# Patient Record
Sex: Male | Born: 1985 | Race: White | Hispanic: No | Marital: Married | Smoking: Never smoker
Health system: Southern US, Community
[De-identification: ages and names within clinical notes are randomized; demographics above are authoritative.]

## PROBLEM LIST (undated history)

## (undated) DIAGNOSIS — T753XXA Motion sickness, initial encounter: Secondary | ICD-10-CM

## (undated) HISTORY — PX: WISDOM TOOTH EXTRACTION: SHX21

## (undated) HISTORY — PX: HERNIA REPAIR: SHX51

---

## 2004-10-03 ENCOUNTER — Emergency Department (HOSPITAL_COMMUNITY): Admission: EM | Admit: 2004-10-03 | Discharge: 2004-10-03 | Payer: Self-pay | Admitting: Family Medicine

## 2004-10-12 ENCOUNTER — Encounter: Admission: RE | Admit: 2004-10-12 | Discharge: 2004-10-12 | Payer: Self-pay | Admitting: Orthopedic Surgery

## 2007-10-15 ENCOUNTER — Emergency Department (HOSPITAL_COMMUNITY): Admission: EM | Admit: 2007-10-15 | Discharge: 2007-10-15 | Payer: Self-pay | Admitting: Family Medicine

## 2011-03-27 LAB — INFLUENZA A AND B ANTIGEN (CONVERTED LAB): Inflenza A Ag: POSITIVE — AB

## 2015-04-28 ENCOUNTER — Encounter: Payer: Self-pay | Admitting: *Deleted

## 2015-05-04 ENCOUNTER — Ambulatory Visit: Payer: BLUE CROSS/BLUE SHIELD | Admitting: Anesthesiology

## 2015-05-04 ENCOUNTER — Ambulatory Visit
Admission: RE | Admit: 2015-05-04 | Discharge: 2015-05-04 | Disposition: A | Discharge status: Home / Self Care | Payer: BLUE CROSS/BLUE SHIELD | Source: Ambulatory Visit | Attending: Surgery | Admitting: Surgery

## 2015-05-04 ENCOUNTER — Encounter
Admission: RE | Disposition: A | Discharge status: Home / Self Care | Payer: Self-pay | Source: Ambulatory Visit | Attending: Surgery

## 2015-05-04 DIAGNOSIS — M25512 Pain in left shoulder: Secondary | ICD-10-CM | POA: Insufficient documentation

## 2015-05-04 DIAGNOSIS — M7542 Impingement syndrome of left shoulder: Secondary | ICD-10-CM | POA: Insufficient documentation

## 2015-05-04 HISTORY — DX: Motion sickness, initial encounter: T75.3XXA

## 2015-05-04 HISTORY — PX: SHOULDER ARTHROSCOPY: SHX128

## 2015-05-04 SURGERY — ARTHROSCOPY, SHOULDER
Anesthesia: Regional | Laterality: Left | Wound class: Clean

## 2015-05-04 MED ORDER — OXYCODONE HCL 5 MG PO TABS
5.0000 mg | ORAL_TABLET | Freq: Once | ORAL | Status: AC | PRN
  Administered 2015-05-04: 5 mg via ORAL

## 2015-05-04 MED ORDER — OXYCODONE HCL 5 MG PO TABS: 5.0000 mg | ORAL_TABLET | ORAL | Status: AC | PRN

## 2015-05-04 MED ORDER — CEFAZOLIN SODIUM-DEXTROSE 2-3 GM-% IV SOLR
2.0000 g | Freq: Once | INTRAVENOUS | Status: AC
  Administered 2015-05-04: 2 g via INTRAVENOUS

## 2015-05-04 MED ORDER — ONDANSETRON HCL 4 MG PO TABS: 4.0000 mg | ORAL_TABLET | Freq: Four times a day (QID) | ORAL | Status: DC | PRN

## 2015-05-04 MED ORDER — PROPOFOL 10 MG/ML IV BOLUS
INTRAVENOUS | Status: DC | PRN
Start: 2015-05-04 — End: 2015-05-04
  Administered 2015-05-04: 200 mg via INTRAVENOUS

## 2015-05-04 MED ORDER — METOCLOPRAMIDE HCL 5 MG PO TABS: 5.0000 mg | ORAL_TABLET | Freq: Three times a day (TID) | ORAL | Status: DC | PRN

## 2015-05-04 MED ORDER — POTASSIUM CHLORIDE IN NACL 20-0.9 MEQ/L-% IV SOLN: INTRAVENOUS | Status: DC

## 2015-05-04 MED ORDER — METOCLOPRAMIDE HCL 5 MG/ML IJ SOLN: 5.0000 mg | Freq: Three times a day (TID) | INTRAMUSCULAR | Status: DC | PRN

## 2015-05-04 MED ORDER — LIDOCAINE-EPINEPHRINE (PF) 1 %-1:200000 IJ SOLN
INTRAMUSCULAR | Status: DC | PRN
  Administered 2015-05-04: 30 mL via INTRAMUSCULAR

## 2015-05-04 MED ORDER — PROMETHAZINE HCL 25 MG/ML IJ SOLN: 6.2500 mg | INTRAMUSCULAR | Status: DC | PRN

## 2015-05-04 MED ORDER — DEXAMETHASONE SODIUM PHOSPHATE 4 MG/ML IJ SOLN
INTRAMUSCULAR | Status: DC | PRN
  Administered 2015-05-04: 4 mg via INTRAVENOUS
  Administered 2015-05-04: 4 mg via PERINEURAL

## 2015-05-04 MED ORDER — OXYCODONE HCL 5 MG/5ML PO SOLN: 5.0000 mg | Freq: Once | ORAL | Status: AC | PRN

## 2015-05-04 MED ORDER — LACTATED RINGERS IV SOLN
INTRAVENOUS | Status: DC | PRN
  Administered 2015-05-04: 4500 mL

## 2015-05-04 MED ORDER — LIDOCAINE HCL (CARDIAC) 20 MG/ML IV SOLN
INTRAVENOUS | Status: DC | PRN
  Administered 2015-05-04: 50 mg via INTRATRACHEAL

## 2015-05-04 MED ORDER — ACETAMINOPHEN 325 MG PO TABS: 325.0000 mg | ORAL_TABLET | ORAL | Status: DC | PRN

## 2015-05-04 MED ORDER — HYDROMORPHONE HCL 1 MG/ML IJ SOLN: 0.2500 mg | INTRAMUSCULAR | Status: DC | PRN

## 2015-05-04 MED ORDER — LACTATED RINGERS IV SOLN
INTRAVENOUS | Status: DC
  Administered 2015-05-04: 12:00:00 via INTRAVENOUS

## 2015-05-04 MED ORDER — OXYCODONE HCL 5 MG PO TABS: 5.0000 mg | ORAL_TABLET | ORAL | Status: DC | PRN

## 2015-05-04 MED ORDER — GLYCOPYRROLATE 0.2 MG/ML IJ SOLN
INTRAMUSCULAR | Status: DC | PRN
  Administered 2015-05-04: .1 mg via INTRAVENOUS

## 2015-05-04 MED ORDER — ONDANSETRON HCL 4 MG/2ML IJ SOLN: 4.0000 mg | Freq: Four times a day (QID) | INTRAMUSCULAR | Status: DC | PRN

## 2015-05-04 MED ORDER — ONDANSETRON HCL 4 MG/2ML IJ SOLN
INTRAMUSCULAR | Status: DC | PRN
  Administered 2015-05-04: 4 mg via INTRAVENOUS

## 2015-05-04 MED ORDER — MIDAZOLAM HCL 2 MG/2ML IJ SOLN
INTRAMUSCULAR | Status: DC | PRN
Start: 2015-05-04 — End: 2015-05-04
  Administered 2015-05-04 (×2): 2 mg via INTRAVENOUS

## 2015-05-04 MED ORDER — ROPIVACAINE HCL 5 MG/ML IJ SOLN
INTRAMUSCULAR | Status: DC | PRN
  Administered 2015-05-04: 200 mg via PERINEURAL

## 2015-05-04 MED ORDER — FENTANYL CITRATE (PF) 100 MCG/2ML IJ SOLN
INTRAMUSCULAR | Status: DC | PRN
  Administered 2015-05-04: 100 ug via INTRAVENOUS

## 2015-05-04 MED ORDER — ACETAMINOPHEN 160 MG/5ML PO SOLN: 325.0000 mg | ORAL | Status: DC | PRN

## 2015-05-04 SURGICAL SUPPLY — 39 items
BIT DRILL JUGRKNT W/NDL BIT2.9 (DRILL) IMPLANT
BLADE FULL RADIUS 3.5 (BLADE) ×2 IMPLANT
BLADE SHAVER 4.5X7 STR FR (MISCELLANEOUS) IMPLANT
BUR ACROMIONIZER 4.0 (BURR) ×2 IMPLANT
BUR BR 5.5 WIDE MOUTH (BURR) IMPLANT
CHLORAPREP W/TINT 26ML (MISCELLANEOUS) ×4 IMPLANT
COVER LIGHT HANDLE UNIVERSAL (MISCELLANEOUS) ×4 IMPLANT
COVER MAYO STAND STRL (DRAPES) ×2 IMPLANT
DRAPE IMP U-DRAPE 54X76 (DRAPES) ×4 IMPLANT
DRILL JUGGERKNOT W/NDL BIT 2.9 (DRILL)
GAUZE PETRO XEROFOAM 1X8 (MISCELLANEOUS) ×2 IMPLANT
GAUZE SPONGE 4X4 12PLY STRL (GAUZE/BANDAGES/DRESSINGS) ×2 IMPLANT
GLOVE BIO SURGEON STRL SZ8 (GLOVE) ×4 IMPLANT
GLOVE INDICATOR 8.0 STRL GRN (GLOVE) ×2 IMPLANT
GOWN STRL REUS W/ TWL LRG LVL3 (GOWN DISPOSABLE) ×1 IMPLANT
GOWN STRL REUS W/ TWL XL LVL3 (GOWN DISPOSABLE) ×1 IMPLANT
GOWN STRL REUS W/TWL LRG LVL3 (GOWN DISPOSABLE) ×2
GOWN STRL REUS W/TWL XL LVL3 (GOWN DISPOSABLE) ×2
IV LACTATED RINGER IRRG 3000ML (IV SOLUTION) ×4
IV LR IRRIG 3000ML ARTHROMATIC (IV SOLUTION) ×2 IMPLANT
KIT CANNULA 8X76-LX IN CANNULA (CANNULA) ×2 IMPLANT
MANIFOLD 4PT FOR NEPTUNE1 (MISCELLANEOUS) ×2 IMPLANT
MAT BLUE FLOOR 46X72 FLO (MISCELLANEOUS) ×2 IMPLANT
NDL HYPO 21X1.5 SAFETY (NEEDLE) ×1 IMPLANT
NDL MAYO CATGUT SZ5 (NEEDLE)
NDL SUT 5 .5 CRC TPR PNT MAYO (NEEDLE) IMPLANT
NEEDLE HYPO 21X1.5 SAFETY (NEEDLE) ×2 IMPLANT
PACK ARTHROSCOPY SHOULDER (MISCELLANEOUS) ×2 IMPLANT
PAD GROUND ADULT SPLIT (MISCELLANEOUS) IMPLANT
SLING ARM LRG DEEP (SOFTGOODS) ×1 IMPLANT
STAPLER SKIN PROX 35W (STAPLE) ×2 IMPLANT
STRAP BODY AND KNEE 60X3 (MISCELLANEOUS) ×4 IMPLANT
SUT ETHIBOND 0 MO6 C/R (SUTURE) IMPLANT
SUT VIC AB 2-0 CT1 27 (SUTURE)
SUT VIC AB 2-0 CT1 TAPERPNT 27 (SUTURE) IMPLANT
SYR 30ML LL (SYRINGE) IMPLANT
TAPE MICROFOAM 4IN (TAPE) ×2 IMPLANT
TUBING ARTHRO INFLOW-ONLY STRL (TUBING) ×2 IMPLANT
WAND HAND CNTRL MULTIVAC 90 (MISCELLANEOUS) ×2 IMPLANT

## 2015-05-04 NOTE — Progress Notes (Signed)
Assisted Cristi LoronJoshua Freidman, DO  with left, ultrasound guided, interscalene  block. Side rails up, monitors on throughout procedure. See vital signs in flow sheet. Tolerated Procedure well.

## 2015-05-04 NOTE — Anesthesia Preprocedure Evaluation (Signed)
Anesthesia Evaluation  Patient identified by MRN, date of birth, ID band Patient awake    Reviewed: Allergy & Precautions, NPO status , Patient's Chart, lab work & pertinent test results, reviewed documented beta blocker date and time   Airway Mallampati: I  TM Distance: >3 FB Neck ROM: Full    Dental no notable dental hx.    Pulmonary neg pulmonary ROS,    Pulmonary exam normal        Cardiovascular negative cardio ROS Normal cardiovascular exam     Neuro/Psych negative neurological ROS     GI/Hepatic negative GI ROS, Neg liver ROS,   Endo/Other  negative endocrine ROS  Renal/GU negative Renal ROS  negative genitourinary   Musculoskeletal negative musculoskeletal ROS (+)   Abdominal   Peds  Hematology negative hematology ROS (+)   Anesthesia Other Findings   Reproductive/Obstetrics negative OB ROS                             Anesthesia Physical Anesthesia Plan  ASA: I  Anesthesia Plan: General and Regional   Post-op Pain Management: GA combined w/ Regional for post-op pain   Induction: Intravenous  Airway Management Planned: LMA  Additional Equipment:   Intra-op Plan:   Post-operative Plan:   Informed Consent: I have reviewed the patients History and Physical, chart, labs and discussed the procedure including the risks, benefits and alternatives for the proposed anesthesia with the patient or authorized representative who has indicated his/her understanding and acceptance.     Plan Discussed with: CRNA  Anesthesia Plan Comments:         Anesthesia Quick Evaluation

## 2015-05-04 NOTE — Op Note (Signed)
05/04/2015  2:42 PM  Patient:   Marvin Ellis  Pre-Op Diagnosis:    Impingement/tendinopathy with possible SLAP tear, left shoulder.  Postoperative diagnosis: Impingement/tendinopathy, left shoulder.  Procedure:  limited arthroscopic debridement and arthroscopic subacromial decompression, left shoulder.  Anesthesia: General endotracheal with interscalene block placed preoperatively by the anesthesiologist.  Surgeon:   Maryagnes AmosJ. Jeffrey Poggi, MD  Assistant:   None  Findings: As above.  The labrum was noted to have a small area of fraying anteriorly but otherwise was intact. The rotator cuff was intact , as was the long head of thebiceps tendon. The articular surfaces of the glenoid and humeral head both were in excellent condition.  Complications: None  Fluids:   900 cc  Estimated blood loss: 10 cc  Tourniquet time: None  Drains: None  Closure: Staples   Brief clinical note: The patient is  A 29 year old male with a 1-1/2 year history of persistent postero-lateral left shoulder pain. The patient's symptoms have progressed despite medications, activity modification, etc. The patient's history and examination are consistent with impingement/tendinopathy with a possible SLAP tear.  An MRI scan demonstrated tendinopathy changes of the supraspinatus more so than the infraspinatus or subscapularis tendons but showed no evidence for partial full-thickness rotator cuff tear. It also was equivocal for any labral pathology. The patient presents at this time for definitive management of these shoulder symptoms.  Procedure: The patient underwent placement of an interscalene block by the anesthesiologist in the preoperative holding area before he was brought into the operating room. The patient then underwent general  laryngo-mask anesthesia before being repositioned in the beach chair position using the beach chair positioner. The  left shoulder and upper extremity were prepped  with ChloraPrep solution before being draped sterilely. Preoperative antibiotics were administered. A timeout was performed to confirm the proper surgical site before the expected portal sites and incision site were injected with 0.5% Sensorcaine with epinephrine. A posterior portal was created and the glenohumeral joint thoroughly inspected with the findings as described above. An anterior portal was created using an outside-in technique. The labrum and rotator cuff were further probed, again confirming the above-noted findings.  The small area of labral fraying anteriorly was debrided back to stable margins using the full-radius resector, as were some areas of reactive synovitis postero-superiorly and anteriorly. The ArthroCare wand was inserted and used to obtain hemostasis as well as to "anneal" the labrum anteriorly. The instruments were removed from the joint after suctioning the excess fluid.  The camera was repositioned through the posterior portal into the subacromial space. A separate lateral portal was created using an outside-in technique. The 3.5 mm full-radius resector was introduced and used to perform a subtotal bursectomy. The ArthroCare wand was then inserted and used to remove the periosteal tissue off the undersurface of the anterior third of the acromion as well as to recess the coracoacromial ligament from its attachment along the anterior and lateral margins of the acromion. The 4.0 mm acromionizing bur was introduced and used to complete the decompression by removing the undersurface of the anterior third of the acromion. The full radius resector was reintroduced to remove any residual bony debris before the ArthroCare wand was reintroduced to obtain hemostasis. The bursal surface of the rotator cuff was carefully inspected. There were some areas of superficial fraying but no evidence for any partial or full-thickness rotator cuff tears. The areas of bursal surface fraying were lightly  abraded with the full-radius resector back to stable margins. The instruments  were then removed from the subacromial space after suctioning the excess fluid.  The portal sites were closed using staples. A sterile bulky dressing was applied to the shoulder before the arm was placed into a shoulder sling. The patient was then awakened, extubated, and returned to the recovery room in satisfactory condition after tolerating the procedure well.

## 2015-05-04 NOTE — H&P (Signed)
Paper H&P to be scanned into permanent record. H&P reviewed. No changes. 

## 2015-05-04 NOTE — Anesthesia Postprocedure Evaluation (Signed)
  Anesthesia Post-op Note  Patient: Marvin Ellis  Procedure(s) Performed: Procedure(s): ARTHROSCOPY SHOULDER DEBRIDEMENT DECOMPRESSION POSS SLAP REPAIR (Left)  Anesthesia type:General, Regional  Patient location: PACU  Post pain: Pain level controlled  Post assessment: Post-op Vital signs reviewed, Patient's Cardiovascular Status Stable, Respiratory Function Stable, Patent Airway and No signs of Nausea or vomiting  Post vital signs: Reviewed and stable  Last Vitals:  Filed Vitals:   05/04/15 1432  BP: 124/81  Pulse: 96  Temp: 36.4 C  Resp: 20    Level of consciousness: awake, alert  and patient cooperative  Complications: No apparent anesthesia complications

## 2015-05-04 NOTE — Transfer of Care (Signed)
Immediate Anesthesia Transfer of Care Note  Patient: Marvin Ellis  Procedure(s) Performed: Procedure(s): ARTHROSCOPY SHOULDER DEBRIDEMENT DECOMPRESSION POSS SLAP REPAIR (Left)  Patient Location: PACU  Anesthesia Type: General, Regional  Level of Consciousness: awake, alert  and patient cooperative  Airway and Oxygen Therapy: Patient Spontanous Breathing and Patient connected to supplemental oxygen  Post-op Assessment: Post-op Vital signs reviewed, Patient's Cardiovascular Status Stable, Respiratory Function Stable, Patent Airway and No signs of Nausea or vomiting  Post-op Vital Signs: Reviewed and stable  Complications: No apparent anesthesia complications

## 2015-05-04 NOTE — Discharge Instructions (Addendum)
General Anesthesia, Adult, Care After Refer to this sheet in the next few weeks. These instructions provide you with information on caring for yourself after your procedure. Your health care provider may also give you more specific instructions. Your treatment has been planned according to current medical practices, but problems sometimes occur. Call your health care provider if you have any problems or questions after your procedure. WHAT TO EXPECT AFTER THE PROCEDURE After the procedure, it is typical to experience:  Sleepiness.  Nausea and vomiting. HOME CARE INSTRUCTIONS  For the first 24 hours after general anesthesia:  Have a responsible person with you.  Do not drive a car. If you are alone, do not take public transportation.  Do not drink alcohol.  Do not take medicine that has not been prescribed by your health care provider.  Do not sign important papers or make important decisions.  You may resume a normal diet and activities as directed by your health care provider.  Change bandages (dressings) as directed.  If you have questions or problems that seem related to general anesthesia, call the hospital and ask for the anesthetist or anesthesiologist on call. SEEK MEDICAL CARE IF:  You have nausea and vomiting that continue the day after anesthesia.  You develop a rash. SEEK IMMEDIATE MEDICAL CARE IF:   You have difficulty breathing.  You have chest pain.  You have any allergic problems.   This information is not intended to replace advice given to you by your health care provider. Make sure you discuss any questions you have with your health care provider.   Document Released: 09/24/2000 Document Revised: 07/09/2014 Document Reviewed: 10/17/2011 Elsevier Interactive Patient Education 2016 [employer]  Keep dressing dry and intact.  May shower after dressing changed on post-op day #4 (Sunday).  Cover staples with Band-Aids after drying off. Apply ice  frequently to shoulder. May remove shoulder immobilizer before first shower, then wear it as necessary. Follow-up in 10-14 days or as scheduled.

## 2015-05-04 NOTE — Anesthesia Procedure Notes (Addendum)
Anesthesia Regional Block:  Interscalene brachial plexus block  Pre-Anesthetic Checklist: ,, timeout performed, Correct Patient, Correct Site, Correct Laterality, Correct Procedure, Correct Position, risks and benefits discussed,, pre-op evaluation, at surgeon's request and post-op pain management  Laterality: Left  Prep: chloraprep       Needles:  Injection technique: Single-shot  Needle Type: Stimiplex     Needle Length: 4cm 4 cm Needle Gauge: 21 and 21 G    Additional Needles:  Procedures: ultrasound guided (picture in chart) Interscalene brachial plexus block Narrative:   Performed by: Personally  Anesthesiologist: Harolyn RutherfordFRIEDMAN, JOSHUA   Procedure Name: LMA Insertion Date/Time: 05/04/2015 1:22 PM Performed by: Jimmy PicketAMYOT, Agam Tuohy Pre-anesthesia Checklist: Patient identified, Emergency Drugs available, Suction available, Timeout performed and Patient being monitored Patient Re-evaluated:Patient Re-evaluated prior to inductionOxygen Delivery Method: Circle system utilized Preoxygenation: Pre-oxygenation with 100% oxygen Intubation Type: IV induction LMA: LMA inserted LMA Size: 4.0 Number of attempts: 1 Placement Confirmation: positive ETCO2 and breath sounds checked- equal and bilateral Tube secured with: Tape

## 2015-05-05 ENCOUNTER — Encounter: Payer: Self-pay | Admitting: Surgery

## 2015-12-22 DIAGNOSIS — Z23 Encounter for immunization: Secondary | ICD-10-CM | POA: Diagnosis not present

## 2015-12-22 DIAGNOSIS — Z0000 Encounter for general adult medical examination without abnormal findings: Secondary | ICD-10-CM | POA: Diagnosis not present

## 2015-12-22 DIAGNOSIS — Z6827 Body mass index (BMI) 27.0-27.9, adult: Secondary | ICD-10-CM | POA: Diagnosis not present

## 2016-10-11 DIAGNOSIS — J018 Other acute sinusitis: Secondary | ICD-10-CM | POA: Diagnosis not present

## 2017-02-19 ENCOUNTER — Ambulatory Visit (INDEPENDENT_AMBULATORY_CARE_PROVIDER_SITE_OTHER): Payer: Self-pay | Admitting: Nurse Practitioner

## 2017-02-19 VITALS — BP 122/64 | HR 72 | Temp 97.8°F | Wt 188.2 lb

## 2017-02-19 DIAGNOSIS — R42 Dizziness and giddiness: Secondary | ICD-10-CM

## 2017-02-19 MED ORDER — MECLIZINE HCL 32 MG PO TABS: 32.0000 mg | ORAL_TABLET | Freq: Three times a day (TID) | ORAL | 0 refills | Status: AC | PRN

## 2017-02-19 MED ORDER — PREDNISONE 10 MG (21) PO TBPK: ORAL_TABLET | ORAL | 6 refills | Status: AC

## 2017-02-19 NOTE — Patient Instructions (Addendum)
Vertigo Vertigo is the feeling that you or your surroundings are moving when they are not. Vertigo can be dangerous if it occurs while you are doing something that could endanger you or others, such as driving. What are the causes? This condition is caused by a disturbance in the signals that are sent by your body's sensory systems to your brain. Different causes of a disturbance can lead to vertigo, including:  Infections, especially in the inner ear.  A bad reaction to a drug, or misuse of alcohol and medicines.  Withdrawal from drugs or alcohol.  Quickly changing positions, as when lying down or rolling over in bed.  Migraine headaches.  Decreased blood flow to the brain.  Decreased blood pressure.  Increased pressure in the brain from a head or neck injury, stroke, infection, tumor, or bleeding.  Central nervous system disorders.  What are the signs or symptoms? Symptoms of this condition usually occur when you move your head or your eyes in different directions. Symptoms may start suddenly, and they usually last for less than a minute. Symptoms may include:  Loss of balance and falling.  Feeling like you are spinning or moving.  Feeling like your surroundings are spinning or moving.  Nausea and vomiting.  Blurred vision or double vision.  Difficulty hearing.  Slurred speech.  Dizziness.  Involuntary eye movement (nystagmus).  Symptoms can be mild and cause only slight annoyance, or they can be severe and interfere with daily life. Episodes of vertigo may return (recur) over time, and they are often triggered by certain movements. Symptoms may improve over time. How is this diagnosed? This condition may be diagnosed based on medical history and the quality of your nystagmus. Your health care provider may test your eye movements by asking you to quickly change positions to trigger the nystagmus. This may be called the Dix-Hallpike test, head thrust test, or roll test.  You may be referred to a health care provider who specializes in ear, nose, and throat (ENT) problems (otolaryngologist) or a provider who specializes in disorders of the central nervous system (neurologist). You may have additional testing, including:  A physical exam.  Blood tests.  MRI.  A CT scan.  An electrocardiogram (ECG). This records electrical activity in your heart.  An electroencephalogram (EEG). This records electrical activity in your brain.  Hearing tests.  How is this treated? Treatment for this condition depends on the cause and the severity of the symptoms. Treatment options include:  Medicines to treat nausea or vertigo. These are usually used for severe cases. Some medicines that are used to treat other conditions may also reduce or eliminate vertigo symptoms. These include: ? Medicines that control allergies (antihistamines). ? Medicines that control seizures (anticonvulsants). ? Medicines that relieve depression (antidepressants). ? Medicines that relieve anxiety (sedatives).  Head movements to adjust your inner ear back to normal. If your vertigo is caused by an ear problem, your health care provider may recommend certain movements to correct the problem.  Surgery. This is rare.  Follow these instructions at home: Safety  Move slowly.Avoid sudden body or head movements.  Avoid driving.  Avoid operating heavy machinery.  Avoid doing any tasks that would cause danger to you or others if you would have a vertigo episode during the task.  If you have trouble walking or keeping your balance, try using a cane for stability. If you feel dizzy or unstable, sit down right away.  Return to your normal activities as told by your  health care provider. Ask your health care provider what activities are safe for you. General instructions  Take over-the-counter and prescription medicines only as told by your health care provider.  Avoid certain positions or  movements as told by your health care provider.  Drink enough fluid to keep your urine clear or pale yellow.  Keep all follow-up visits as told by your health care provider. This is important. Contact a health care provider if:  Your medicines do not relieve your vertigo or they make it worse.  You have a fever.  Your condition gets worse or you develop new symptoms.  Your family or friends notice any behavioral changes.  Your nausea or vomiting gets worse.  You have numbness or a "pins and needles" sensation in part of your body. Get help right away if:  You have difficulty moving or speaking.  You are always dizzy.  You faint.  You develop severe headaches.  You have weakness in your hands, arms, or legs.  You have changes in your hearing or vision.  You develop a stiff neck.  You develop sensitivity to light. This information is not intended to replace advice given to you by your health care provider. Make sure you discuss any questions you have with your health care provider. Document Released: 03/28/2005 Document Revised: 11/30/2015 Document Reviewed: 10/11/2014 Elsevier Interactive Patient Education  2017 [employer] Inc[employer]  Dizziness Dizziness is a common problem. It is a feeling of unsteadiness or light-headedness. You may feel like you are about to faint. Dizziness can lead to injury if you stumble or fall. Anyone can become dizzy, but dizziness is more common in older adults. This condition can be caused by a number of things, including medicines, dehydration, or illness. Follow these instructions at home: Taking these steps may help with your condition: Eating and drinking  Drink enough fluid to keep your urine clear or pale yellow. This helps to keep you from becoming dehydrated. Try to drink more clear fluids, such as water.  Do not drink alcohol.  Limit your caffeine intake if directed by your health care provider.  Limit your salt intake if directed by  your health care provider. Activity  Avoid making quick movements. ? Rise slowly from chairs and steady yourself until you feel okay. ? In the morning, first sit up on the side of the bed. When you feel okay, stand slowly while you hold onto something until you know that your balance is fine.  Move your legs often if you need to stand in one place for a long time. Tighten and relax your muscles in your legs while you are standing.  Do not drive or operate heavy machinery if you feel dizzy.  Avoid bending down if you feel dizzy. Place items in your home so that they are easy for you to reach without leaning over. Lifestyle  Do not use any tobacco products, including cigarettes, chewing tobacco, or electronic cigarettes. If you need help quitting, ask your health care provider.  Try to reduce your stress level, such as with yoga or meditation. Talk with your health care provider if you need help. General instructions  Watch your dizziness for any changes.  Take medicines only as directed by your health care provider. Talk with your health care provider if you think that your dizziness is caused by a medicine that you are taking.  Tell a friend or a family member that you are feeling dizzy. If he or she notices any changes in  your behavior, have this person call your health care provider.  Keep all follow-up visits as directed by your health care provider. This is important. Contact a health care provider if:  Your dizziness does not go away.  Your dizziness or light-headedness gets worse.  You feel nauseous.  You have reduced hearing.  You have new symptoms.  You are unsteady on your feet or you feel like the room is spinning. Get help right away if:  You vomit or have diarrhea and are unable to eat or drink anything.  You have problems talking, walking, swallowing, or using your arms, hands, or legs.  You feel generally weak.  You are not thinking clearly or you have  trouble forming sentences. It may take a friend or family member to notice this.  You have chest pain, abdominal pain, shortness of breath, or sweating.  Your vision changes.  You notice any bleeding.  You have a headache.  You have neck pain or a stiff neck.  You have a fever. This information is not intended to replace advice given to you by your health care provider. Make sure you discuss any questions you have with your health care provider. Document Released: 12/12/2000 Document Revised: 11/24/2015 Document Reviewed: 06/14/2014 Elsevier Interactive Patient Education  2017 [employer] Inc[employer] How to Perform the Epley Maneuver The Epley maneuver is an exercise that relieves symptoms of vertigo. Vertigo is the feeling that you or your surroundings are moving when they are not. When you feel vertigo, you may feel like the room is spinning and have trouble walking. Dizziness is a little different than vertigo. When you are dizzy, you may feel unsteady or light-headed. You can do this maneuver at home whenever you have symptoms of vertigo. You can do it up to 3 times a day until your symptoms go away. Even though the Epley maneuver may relieve your vertigo for a few weeks, it is possible that your symptoms will return. This maneuver relieves vertigo, but it does not relieve dizziness. What are the risks? If it is done correctly, the Epley maneuver is considered safe. Sometimes it can lead to dizziness or nausea that goes away after a short time. If you develop other symptoms, such as changes in vision, weakness, or numbness, stop doing the maneuver and call your health care provider. How to perform the Epley maneuver 1. Sit on the edge of a bed or table with your back straight and your legs extended or hanging over the edge of the bed or table. 2. Turn your head halfway toward the affected ear or side. 3. Lie backward quickly with your head turned until you are lying flat on your back. You may  want to position a pillow under your shoulders. 4. Hold this position for 30 seconds. You may experience an attack of vertigo. This is normal. 5. Turn your head to the opposite direction until your unaffected ear is facing the floor. 6. Hold this position for 30 seconds. You may experience an attack of vertigo. This is normal. Hold this position until the vertigo stops. 7. Turn your whole body to the same side as your head. Hold for another 30 seconds. 8. Sit back up. You can repeat this exercise up to 3 times a day. Follow these instructions at home:  After doing the Epley maneuver, you can return to your normal activities.  Ask your health care provider if there is anything you should do at home to prevent vertigo. He or she may  recommend that you: ? Keep your head raised (elevated) with two or more pillows while you sleep. ? Do not sleep on the side of your affected ear. ? Get up slowly from bed. ? Avoid sudden movements during the day. ? Avoid extreme head movement, like looking up or bending over. Contact a health care provider if:  Your vertigo gets worse.  You have other symptoms, including: ? Nausea. ? Vomiting. ? Headache. Get help right away if:  You have vision changes.  You have a severe or worsening headache or neck pain.  You cannot stop vomiting.  You have new numbness or weakness in any part of your body. Summary  Vertigo is the feeling that you or your surroundings are moving when they are not.  The Epley maneuver is an exercise that relieves symptoms of vertigo.  If the Epley maneuver is done correctly, it is considered safe. You can do it up to 3 times a day. This information is not intended to replace advice given to you by your health care provider. Make sure you discuss any questions you have with your health care provider. Document Released: 06/23/2013 Document Revised: 05/08/2016 Document Reviewed: 05/08/2016 Elsevier Interactive Patient Education   2017 [employer].

## 2017-02-19 NOTE — Progress Notes (Signed)
Subjective:     Marvin Ellis is a 31 y.o. male who presents for evaluation of dizziness. The symptoms started 1 day ago and are worse. The attacks occur daily and last approximately 5-10 seconds. Patient states that "I feel like I am spinning".  Positions that worsen symptoms: bending over, rolling in bed to the left, rolling in bed to the right and turning head. Previous workup/treatments: none. Associated ear symptoms: none. Associated CNS symptoms: none and dizziness. Patient admits to using q-tips to clean his ears and felt that he possible inserted the q-tip too far on yesterday.   Recent infections: none. Head trauma: denied. Drug ingestion: none. Noise exposure: no occupational exposure. Family history: non-contributory.  The following portions of the patient's history were reviewed and updated as appropriate: allergies, current medications and past medical history.  Review of Systems Constitutional: positive for chills Eyes: negative Ears, nose, mouth, throat, and face: positive for uses q-tips to clean ears, negative for earaches, nasal congestion and sore throat Respiratory: negative Cardiovascular: negative Gastrointestinal: positive for nausea Neurological: positive for coordination problems, dizziness and vertigo, negative for memory problems, paresthesia and seizures    Objective:    BP 122/64   Pulse 72   Temp 97.8 F (36.6 C)   Wt 188 lb 3.2 oz (85.4 kg)   SpO2 97%   BMI 26.25 kg/m  General appearance: alert, cooperative and no distress Head: Normocephalic, without obvious abnormality, atraumatic Eyes: conjunctivae/corneas clear. PERRL, EOM's intact. Fundi benign. Ears: normal TM's and external ear canals both ears Nose: Nares normal. Septum midline. Mucosa normal. No drainage or sinus tenderness. Throat: lips, mucosa, and tongue normal; teeth and gums normal Lungs: clear to auscultation bilaterally Abdomen: soft, non-tender; bowel sounds normal; no masses,  no  organomegaly Neurologic: Alert and oriented X 3, normal strength and tone. Normal symmetric reflexes. Normal coordination and gait      Assessment:    Vertigo    Plan:    Meclizine per medication orders. Steroids per medication orders. Stop using q-tips in ears.  Follow up in the ER if weakness, unsteady gait  Use Scoplamine patches as needed for nausea.  Go to ER if increasing weakness, worsening gait, inability to get out of bed or worsening dizziness.  Patient will follow up as needed.

## 2017-02-22 ENCOUNTER — Telehealth: Payer: Self-pay

## 2017-07-05 DIAGNOSIS — D1801 Hemangioma of skin and subcutaneous tissue: Secondary | ICD-10-CM | POA: Diagnosis not present

## 2017-07-05 DIAGNOSIS — L578 Other skin changes due to chronic exposure to nonionizing radiation: Secondary | ICD-10-CM | POA: Diagnosis not present

## 2017-07-05 DIAGNOSIS — D225 Melanocytic nevi of trunk: Secondary | ICD-10-CM | POA: Diagnosis not present

## 2017-10-31 DIAGNOSIS — Z1339 Encounter for screening examination for other mental health and behavioral disorders: Secondary | ICD-10-CM | POA: Diagnosis not present

## 2017-10-31 DIAGNOSIS — Z13220 Encounter for screening for lipoid disorders: Secondary | ICD-10-CM | POA: Diagnosis not present

## 2017-10-31 DIAGNOSIS — K589 Irritable bowel syndrome without diarrhea: Secondary | ICD-10-CM | POA: Diagnosis not present

## 2017-10-31 DIAGNOSIS — Z1331 Encounter for screening for depression: Secondary | ICD-10-CM | POA: Diagnosis not present

## 2017-10-31 DIAGNOSIS — Z0000 Encounter for general adult medical examination without abnormal findings: Secondary | ICD-10-CM | POA: Diagnosis not present

## 2017-12-03 DIAGNOSIS — M7522 Bicipital tendinitis, left shoulder: Secondary | ICD-10-CM | POA: Diagnosis not present

## 2017-12-06 ENCOUNTER — Other Ambulatory Visit: Payer: Self-pay | Admitting: Orthopedic Surgery

## 2017-12-06 DIAGNOSIS — M7522 Bicipital tendinitis, left shoulder: Secondary | ICD-10-CM

## 2017-12-13 ENCOUNTER — Other Ambulatory Visit: Payer: BLUE CROSS/BLUE SHIELD

## 2017-12-13 ENCOUNTER — Inpatient Hospital Stay: Admission: RE | Admit: 2017-12-13 | Payer: BLUE CROSS/BLUE SHIELD | Source: Ambulatory Visit

## 2017-12-13 ENCOUNTER — Ambulatory Visit
Admission: RE | Admit: 2017-12-13 | Discharge: 2017-12-13 | Disposition: A | Discharge status: Home / Self Care | Payer: BLUE CROSS/BLUE SHIELD | Source: Ambulatory Visit | Attending: Orthopedic Surgery | Admitting: Orthopedic Surgery

## 2017-12-13 DIAGNOSIS — M25512 Pain in left shoulder: Secondary | ICD-10-CM | POA: Diagnosis not present

## 2017-12-13 DIAGNOSIS — S4992XA Unspecified injury of left shoulder and upper arm, initial encounter: Secondary | ICD-10-CM | POA: Diagnosis not present

## 2017-12-13 DIAGNOSIS — M7522 Bicipital tendinitis, left shoulder: Secondary | ICD-10-CM

## 2018-01-07 DIAGNOSIS — M7522 Bicipital tendinitis, left shoulder: Secondary | ICD-10-CM | POA: Diagnosis not present

## 2018-01-29 DIAGNOSIS — L255 Unspecified contact dermatitis due to plants, except food: Secondary | ICD-10-CM | POA: Diagnosis not present

## 2018-03-22 DIAGNOSIS — Z23 Encounter for immunization: Secondary | ICD-10-CM | POA: Diagnosis not present

## 2018-08-21 DIAGNOSIS — J019 Acute sinusitis, unspecified: Secondary | ICD-10-CM | POA: Diagnosis not present

## 2018-08-21 DIAGNOSIS — R0981 Nasal congestion: Secondary | ICD-10-CM | POA: Diagnosis not present

## 2019-01-01 IMAGING — MR MR SHOULDER*L* W/O CM
4 of 5 series · 27 of 40 positions shown · non-contrast
Comparison: None.

CLINICAL DATA: Left shoulder pain from golfing injury.

EXAM:
MRI OF THE LEFT SHOULDER WITHOUT CONTRAST
TECHNIQUE: Multiplanar, multisequence MR imaging of the shoulder was performed.
No intravenous contrast was administered.

[Series 5: PD fat-sat · axial · 4.0mm · 0.27mm/px · z∈[-37,+39]mm · 9 of 17 slices shown]
[im 1/17]
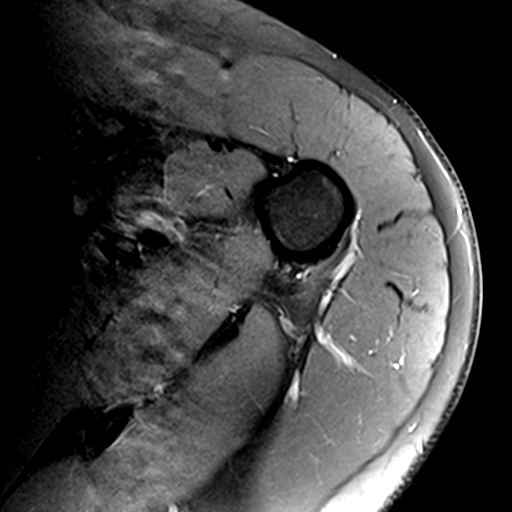
[im 3/17]
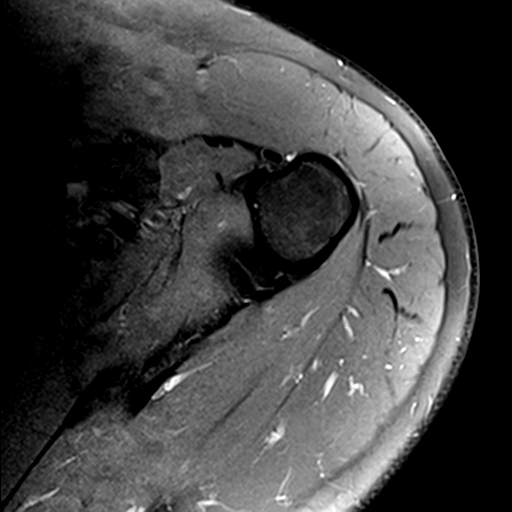
[im 5/17]
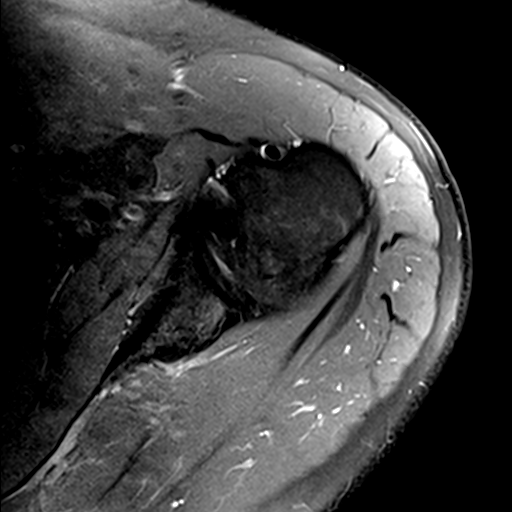
[im 7/17]
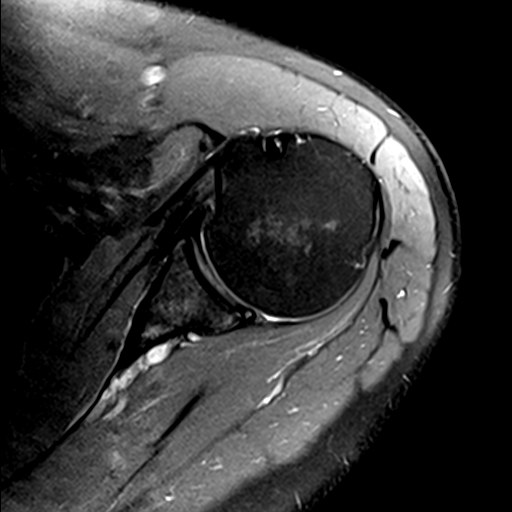
[im 9/17]
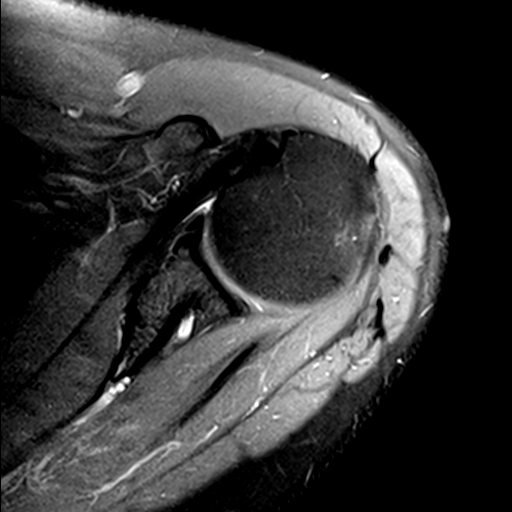
[im 11/17]
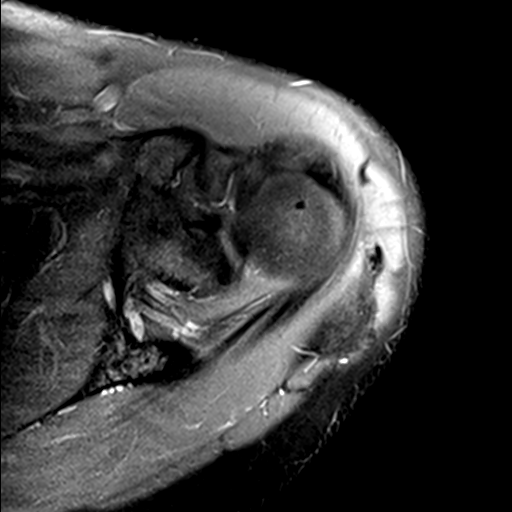
[im 13/17]
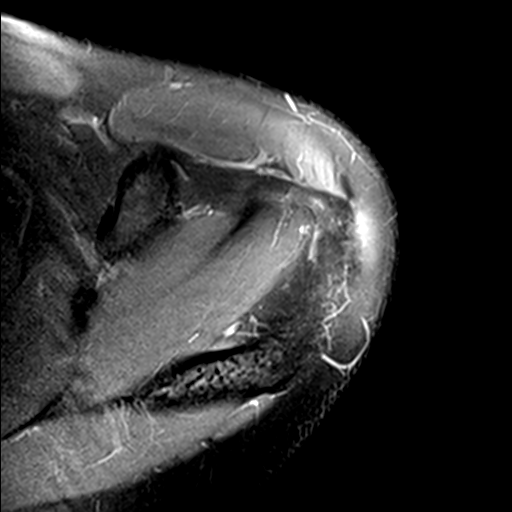
[im 15/17]
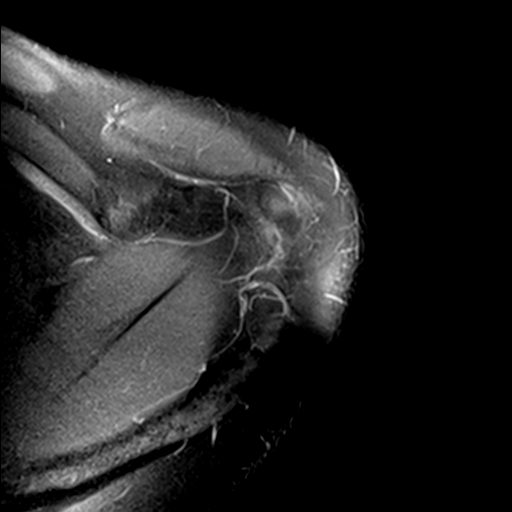
[im 17/17]
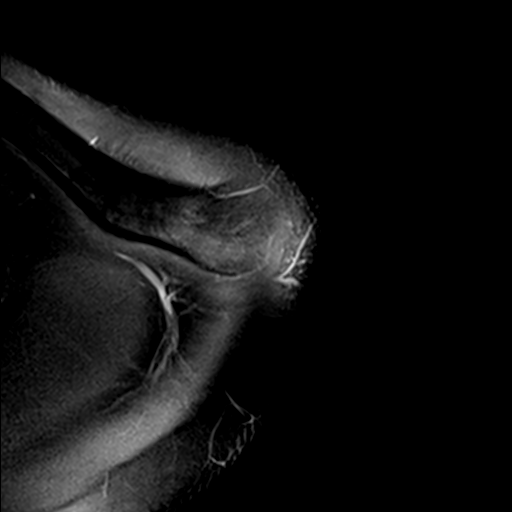

[Series 6: T2 fat-sat · oblique · 4.0mm · 0.55mm/px · 8 of 17 slices shown (1 of 2)]
[im 1/17]
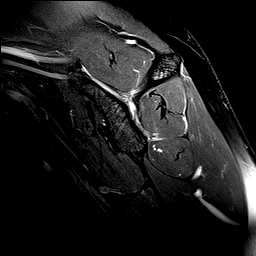
[im 3/17]
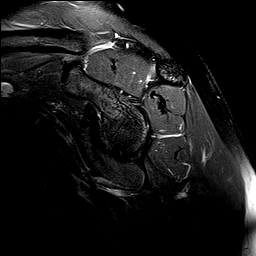
[im 5/17]
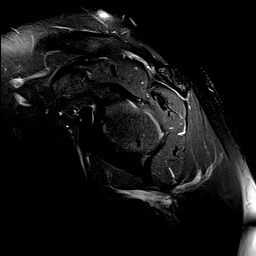
[im 7/17]
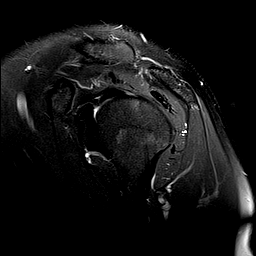
[im 9/17]
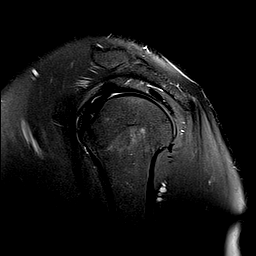
[im 11/17]
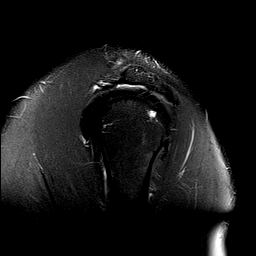
[im 13/17]
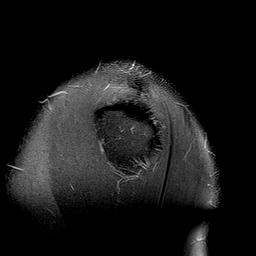
[im 15/17]
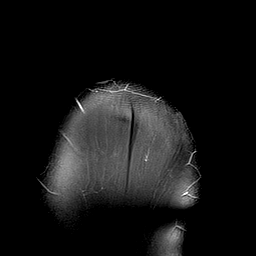

[Series 8: T2 fat-sat · oblique · 4.0mm · 0.55mm/px · 3 of 15 slices shown (2 of 2)]
[im 3/15]
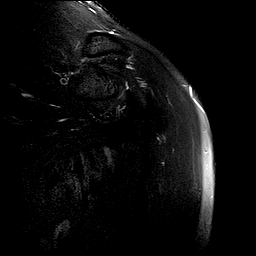
[im 8/15]
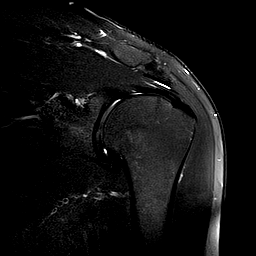
[im 12/15]
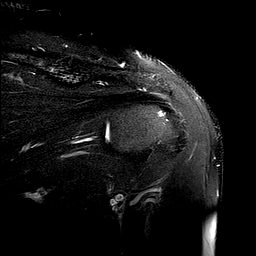

[Series 10: PD · oblique · 4.0mm · 0.27mm/px · 7 of 15 slices shown]
[im 1/15]
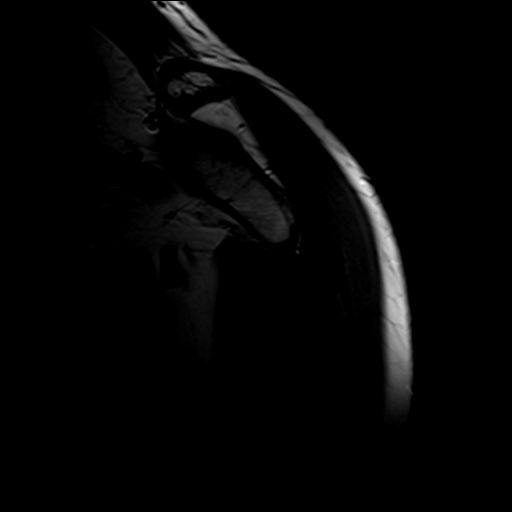
[im 3/15]
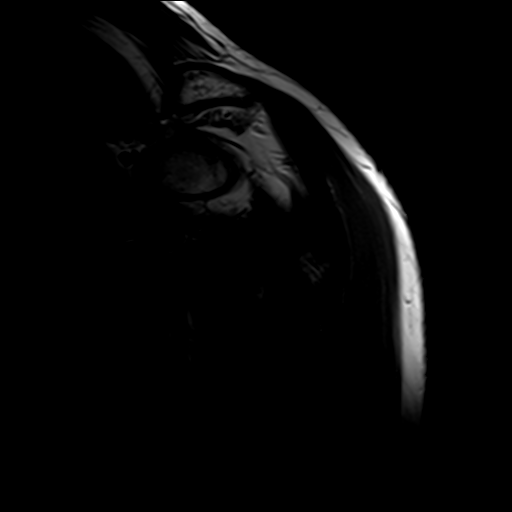
[im 5/15]
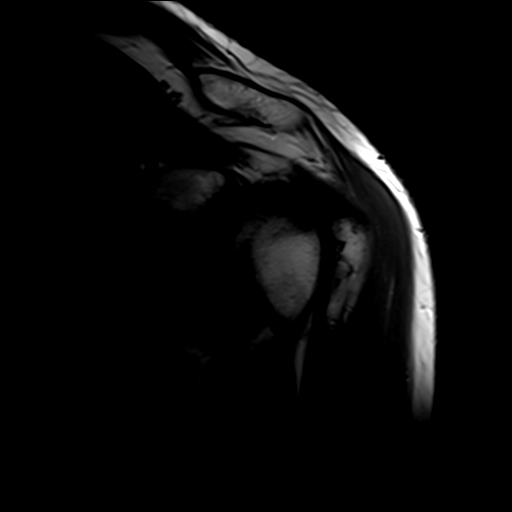
[im 8/15]
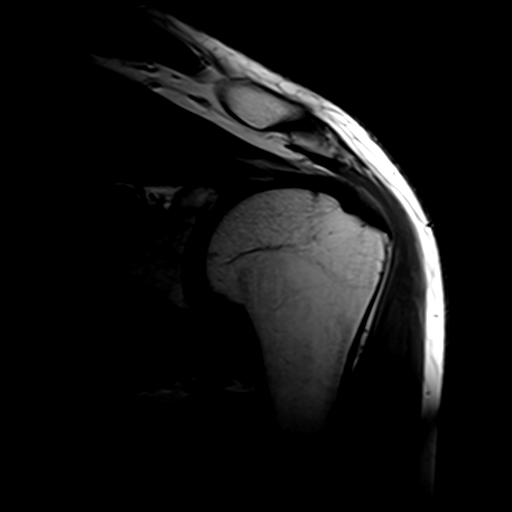
[im 10/15]
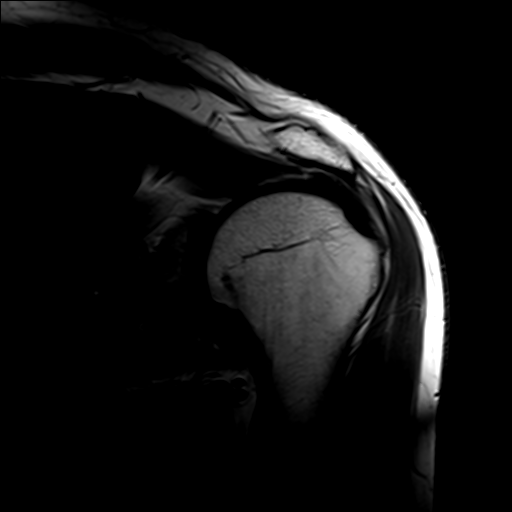
[im 12/15]
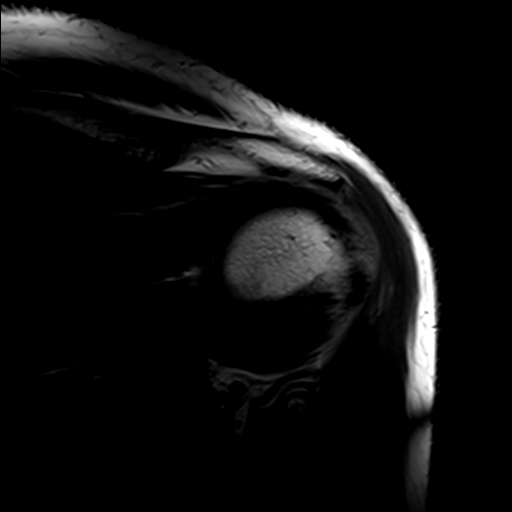
[im 15/15]
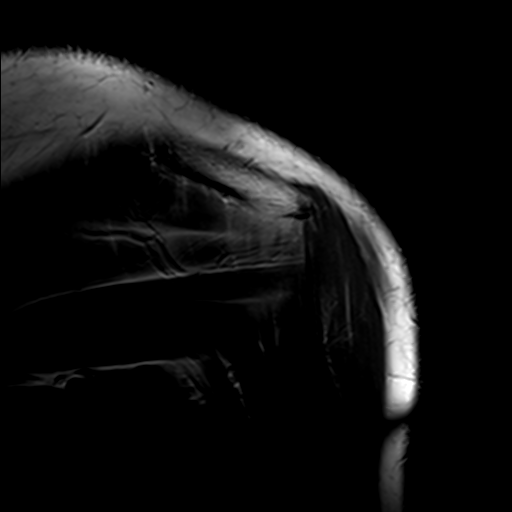

[27 of 40 positions shown; findings below may reference images not displayed]

FINDINGS: Rotator cuff: Supraspinatus tendon is intact. Infraspinatus tendon
is intact. Teres minor tendon is intact. Subscapularis tendon is
intact.

Muscles: No atrophy or fatty replacement of nor abnormal signal
within, the muscles of the rotator cuff.

Biceps long head: Mild tendinosis of the intra-articular portion of
the long head of the biceps tendon.

Acromioclavicular Joint: Normal acromioclavicular joint. Type I
acromion. No subacromial/subdeltoid bursal fluid.

Glenohumeral Joint: No joint effusion. No synovitis. No chondral
defect.

Labrum: Limited evaluation of the labrum secondary lack of
intra-articular fluid. Degeneration of the posterior labrum. No
discrete labral tear.

Bones:  No acute osseous abnormality.  No aggressive osseous lesion.

Other: No fluid collection or hematoma.
IMPRESSION: 1. Mild tendinosis of the intra-articular portion of the long head
of the biceps tendon.
2. No rotator cuff tear.

## 2019-04-01 NOTE — Telephone Encounter (Signed)
done

## 2022-04-30 DIAGNOSIS — Z0000 Encounter for general adult medical examination without abnormal findings: Secondary | ICD-10-CM | POA: Diagnosis not present

## 2022-04-30 DIAGNOSIS — E785 Hyperlipidemia, unspecified: Secondary | ICD-10-CM | POA: Diagnosis not present

## 2022-04-30 DIAGNOSIS — Z1331 Encounter for screening for depression: Secondary | ICD-10-CM | POA: Diagnosis not present

## 2022-04-30 DIAGNOSIS — Z23 Encounter for immunization: Secondary | ICD-10-CM | POA: Diagnosis not present

## 2022-05-04 DIAGNOSIS — Z0000 Encounter for general adult medical examination without abnormal findings: Secondary | ICD-10-CM | POA: Diagnosis not present

## 2022-05-04 DIAGNOSIS — E785 Hyperlipidemia, unspecified: Secondary | ICD-10-CM | POA: Diagnosis not present

## 2024-04-24 DIAGNOSIS — S22000A Wedge compression fracture of unspecified thoracic vertebra, initial encounter for closed fracture: Secondary | ICD-10-CM | POA: Diagnosis not present

## 2024-05-05 DIAGNOSIS — M4854XA Collapsed vertebra, not elsewhere classified, thoracic region, initial encounter for fracture: Secondary | ICD-10-CM | POA: Diagnosis not present

## 2024-05-05 DIAGNOSIS — S22068A Other fracture of T7-T8 thoracic vertebra, initial encounter for closed fracture: Secondary | ICD-10-CM | POA: Diagnosis not present

## 2024-05-20 DIAGNOSIS — S22000A Wedge compression fracture of unspecified thoracic vertebra, initial encounter for closed fracture: Secondary | ICD-10-CM | POA: Diagnosis not present

## 2024-06-16 DIAGNOSIS — S22000D Wedge compression fracture of unspecified thoracic vertebra, subsequent encounter for fracture with routine healing: Secondary | ICD-10-CM | POA: Diagnosis not present
